# Patient Record
Sex: Male | Born: 1965 | Race: Black or African American | Hispanic: No | Smoking: Never smoker
Health system: Southern US, Community
[De-identification: ages and names within clinical notes are randomized; demographics above are authoritative.]

## PROBLEM LIST (undated history)

## (undated) HISTORY — PX: BACK SURGERY: SHX140

## (undated) HISTORY — PX: ROTATOR CUFF REPAIR: SHX139

---

## 1997-06-06 ENCOUNTER — Encounter: Admission: RE | Admit: 1997-06-06 | Discharge: 1997-09-04 | Payer: Self-pay | Admitting: Neurosurgery

## 1997-08-08 ENCOUNTER — Ambulatory Visit (HOSPITAL_COMMUNITY): Admission: RE | Admit: 1997-08-08 | Discharge: 1997-08-08 | Payer: Self-pay | Admitting: Neurosurgery

## 1997-08-22 ENCOUNTER — Ambulatory Visit (HOSPITAL_COMMUNITY): Admission: RE | Admit: 1997-08-22 | Discharge: 1997-08-22 | Payer: Self-pay | Admitting: Neurosurgery

## 1997-09-05 ENCOUNTER — Ambulatory Visit (HOSPITAL_COMMUNITY): Admission: RE | Admit: 1997-09-05 | Discharge: 1997-09-05 | Payer: Self-pay | Admitting: Neurosurgery

## 1997-09-15 ENCOUNTER — Ambulatory Visit (HOSPITAL_COMMUNITY): Admission: RE | Admit: 1997-09-15 | Discharge: 1997-09-15 | Payer: Self-pay | Admitting: Neurosurgery

## 1997-09-18 ENCOUNTER — Observation Stay (HOSPITAL_COMMUNITY): Admission: EM | Admit: 1997-09-18 | Discharge: 1997-09-18 | Payer: Self-pay | Admitting: Emergency Medicine

## 1998-08-02 ENCOUNTER — Encounter: Payer: Self-pay | Admitting: Neurosurgery

## 1998-08-02 ENCOUNTER — Ambulatory Visit (HOSPITAL_COMMUNITY): Admission: RE | Admit: 1998-08-02 | Discharge: 1998-08-02 | Payer: Self-pay | Admitting: Neurosurgery

## 1998-09-19 ENCOUNTER — Encounter: Admission: RE | Admit: 1998-09-19 | Discharge: 1998-10-10 | Payer: Self-pay

## 1999-02-22 ENCOUNTER — Emergency Department (HOSPITAL_COMMUNITY): Admission: EM | Admit: 1999-02-22 | Discharge: 1999-02-22 | Payer: Self-pay | Admitting: *Deleted

## 1999-05-15 ENCOUNTER — Encounter: Payer: Self-pay | Admitting: Family Medicine

## 1999-05-15 ENCOUNTER — Emergency Department (HOSPITAL_COMMUNITY): Admission: EM | Admit: 1999-05-15 | Discharge: 1999-05-15 | Payer: Self-pay | Admitting: Internal Medicine

## 2003-09-20 ENCOUNTER — Emergency Department (HOSPITAL_COMMUNITY): Admission: EM | Admit: 2003-09-20 | Discharge: 2003-09-21 | Payer: Self-pay | Admitting: Emergency Medicine

## 2004-01-22 ENCOUNTER — Ambulatory Visit: Payer: Self-pay | Admitting: Internal Medicine

## 2004-04-04 ENCOUNTER — Ambulatory Visit (HOSPITAL_COMMUNITY): Admission: RE | Admit: 2004-04-04 | Discharge: 2004-04-04 | Payer: Self-pay | Admitting: Family Medicine

## 2010-09-18 LAB — CMP14+LP+1AC+CBC/D/PLT+TSH: Calcium: 9.5 mg/dL

## 2010-10-02 LAB — PSA: PSA: 1.69

## 2012-05-24 LAB — BASIC METABOLIC PANEL
CREATININE: 1 mg/dL (ref 0.6–1.3)
GLUCOSE: 89 mg/dL
Potassium: 4.7 mmol/L (ref 3.4–5.3)
Sodium: 140 mmol/L (ref 137–147)

## 2012-05-24 LAB — HEPATIC FUNCTION PANEL
ALT: 13 U/L (ref 10–40)
AST: 21 U/L (ref 14–40)

## 2012-05-24 LAB — CBC AND DIFFERENTIAL
Hemoglobin: 13.9 g/dL (ref 13.5–17.5)
Platelets: 265 10*3/uL (ref 150–399)
WBC: 5.8 10*3/mL

## 2012-05-24 LAB — LIPID PANEL
Cholesterol: 164 mg/dL (ref 0–200)
HDL: 37 mg/dL (ref 35–70)
LDL Cholesterol: 111 mg/dL

## 2013-10-28 ENCOUNTER — Encounter: Payer: Self-pay | Admitting: Family Medicine

## 2013-10-28 ENCOUNTER — Ambulatory Visit (INDEPENDENT_AMBULATORY_CARE_PROVIDER_SITE_OTHER): Payer: 59 | Admitting: Family Medicine

## 2013-10-28 VITALS — BP 140/89 | HR 64 | Ht 70.0 in | Wt 204.0 lb

## 2013-10-28 DIAGNOSIS — R319 Hematuria, unspecified: Secondary | ICD-10-CM

## 2013-10-28 DIAGNOSIS — Z8042 Family history of malignant neoplasm of prostate: Secondary | ICD-10-CM | POA: Insufficient documentation

## 2013-10-28 DIAGNOSIS — M25511 Pain in right shoulder: Secondary | ICD-10-CM | POA: Insufficient documentation

## 2013-10-28 DIAGNOSIS — E785 Hyperlipidemia, unspecified: Secondary | ICD-10-CM

## 2013-10-28 DIAGNOSIS — I10 Essential (primary) hypertension: Secondary | ICD-10-CM

## 2013-10-28 DIAGNOSIS — M25519 Pain in unspecified shoulder: Secondary | ICD-10-CM

## 2013-10-28 NOTE — Progress Notes (Signed)
CC: Billy Bell is a 48 y.o. male is here for Establish Care   Subjective: HPI:  Very pleasant 48 year old here to establish care  Patient presents of right shoulder pain has been present for the past 2 or 3 months. Worse when lying on her right shoulder. Worse after lifting heavy objects, worse after shifting gears when driving long distances. Pain is described only as a pain and nonradiating, localized deep in the joint. Denies recent over exertion or trauma.  Denies motor or sensory disturbances in the right upper extremity. No interventions as of yet.  Reports a few months ago he noticed bright red blood in his urine. It occurred during his normal state of health but after holding his urine for what sounds to be 6 hours. He's only had this occur once when it occurred it was not accompanied by dysuria, frequency, urgency, nor any other genitourinary complaints. He reports his father died from prostate cancer around age of 66. Patient denies waking in the night to urinate more than once.  On chart review he's had other visits where his systolic blood pressure was 140. He's never been on blood pressure medication the past has a strong family history of hypertension.  He reports a history of hyperlipidemia and has not had his cholesterol checked in many years  Review of Systems - General ROS: negative for - chills, fever, night sweats, weight gain or weight loss Ophthalmic ROS: negative for - decreased vision Psychological ROS: negative for - anxiety or depression ENT ROS: negative for - hearing change, nasal congestion, tinnitus or allergies Hematological and Lymphatic ROS: negative for -, bruising or swollen lymph nodes Breast ROS: negative Respiratory ROS: no cough, shortness of breath, or wheezing Cardiovascular ROS: no chest pain or dyspnea on exertion Gastrointestinal ROS: no abdominal pain, change in bowel habits, or black or bloody stools Genito-Urinary ROS: negative for - genital  discharge, genital ulcers, incontinence Musculoskeletal ROS: negative for - joint pain or muscle pain other than that described above Neurological ROS: negative for - headaches or memory loss Dermatological ROS: negative for lumps, mole changes, rash and skin lesion changes  History reviewed. No pertinent past medical history.  Past Surgical History  Procedure Laterality Date  . Back surgery     History reviewed. No pertinent family history.  History   Social History  . Marital Status: Divorced    Spouse Name: N/A    Number of Children: N/A  . Years of Education: N/A   Occupational History  . Not on file.   Social History Main Topics  . Smoking status: Never Smoker   . Smokeless tobacco: Not on file  . Alcohol Use: Yes     Comment: occasional  . Drug Use: No  . Sexual Activity: Not on file   Other Topics Concern  . Not on file   Social History Narrative  . No narrative on file     Objective: BP 140/89  Pulse 64  Ht  (1.778 m)  Wt 204 lb (92.534 kg)  BMI 29.27 kg/m2  General: Alert and Oriented, No Acute Distress HEENT: Pupils equal, round, reactive to light. Conjunctivae clear.  Moist mucous membranes pharynx unremarkable Lungs: Clear to auscultation bilaterally, no wheezing/ronchi/rales.  Comfortable work of breathing. Good air movement. Cardiac: Regular rate and rhythm. Normal S1/S2.  No murmurs, rubs, nor gallops.   Right shoulder exam reveals full range of motion and strength in all planes of motion and with individual rotator cuff testing. No overlying  redness warmth or swelling.  Neer's test negative.  Hawkins test positive. Empty can positive. Crossarm test positive. O'Brien's test negative. Apprehension test negative. Speed's test negative. Extremities: No peripheral edema.  Strong peripheral pulses.  Mental Status: No depression, anxiety, nor agitation. Skin: Warm and dry.  Assessment & Plan: Billy Bell was seen today for establish care.  Diagnoses and  associated orders for this visit:  Family history of prostate cancer - PSA - Urinalysis, Routine w reflex microscopic  Hematuria - PSA - Urinalysis, Routine w reflex microscopic  Essential hypertension, benign - BASIC METABOLIC PANEL WITH GFR  Hyperlipidemia - Lipid panel  Right shoulder pain    Family history prostate cancer in the setting of hematuria: Check urinalysis with microscopy along with PSA Essential hypertension: Check renal function, stressed importance of exercise, he'll need to reduce the sodium in his diet  And if ineffective we'll start antihypertensives at his next encounter   hyperlipidemia: Due for lipid panel Right shoulder pain: Suspect rotator cuff tendinitis begin home exercise/rehabilitation on a daily basis the next 3 weeks. If no benefit I've asked him to return to see Billy Bell. in sports medicine for further evaluation  Return if symptoms worsen or fail to improve.

## 2013-11-05 LAB — LIPID PANEL
Cholesterol: 161 mg/dL (ref 0–200)
HDL: 40 mg/dL (ref >39)
LDL Cholesterol: 108 mg/dL — ABNORMAL HIGH (ref 0–99)
Total CHOL/HDL Ratio: 4 Ratio
Triglycerides: 64 mg/dL (ref <150)
VLDL: 13 mg/dL (ref 0–40)

## 2013-11-05 LAB — BASIC METABOLIC PANEL WITH GFR
BUN: 11 mg/dL (ref 6–23)
CO2: 28 mEq/L (ref 19–32)
Calcium: 9.2 mg/dL (ref 8.4–10.5)
Chloride: 104 mEq/L (ref 96–112)
Creat: 0.94 mg/dL (ref 0.50–1.35)
GFR, Est African American: >89 mL/min
GFR, Est Non African American: >89 mL/min
Glucose, Bld: 84 mg/dL (ref 70–99)
Potassium: 5 mEq/L (ref 3.5–5.3)
Sodium: 139 mEq/L (ref 135–145)

## 2013-11-05 LAB — URINALYSIS, ROUTINE W REFLEX MICROSCOPIC
Bilirubin Urine: NEGATIVE
Glucose, UA: NEGATIVE mg/dL
Hgb urine dipstick: NEGATIVE
Ketones, ur: NEGATIVE mg/dL
Leukocytes, UA: NEGATIVE
Nitrite: NEGATIVE
Protein, ur: NEGATIVE mg/dL
Specific Gravity, Urine: 1.02 (ref 1.005–1.030)
Urobilinogen, UA: 0.2 mg/dL (ref 0.0–1.0)
pH: 6 (ref 5.0–8.0)

## 2013-11-05 LAB — PSA: PSA: 1.27 ng/mL (ref <=4.00)

## 2013-11-07 ENCOUNTER — Encounter: Payer: Self-pay | Admitting: Family Medicine

## 2013-11-07 DIAGNOSIS — E785 Hyperlipidemia, unspecified: Secondary | ICD-10-CM | POA: Insufficient documentation

## 2014-01-27 ENCOUNTER — Encounter: Payer: Self-pay | Admitting: Family Medicine

## 2014-01-27 ENCOUNTER — Ambulatory Visit (INDEPENDENT_AMBULATORY_CARE_PROVIDER_SITE_OTHER): Payer: 59 | Admitting: Family Medicine

## 2014-01-27 VITALS — BP 138/91 | HR 64 | Ht 70.0 in | Wt 208.0 lb

## 2014-01-27 DIAGNOSIS — R350 Frequency of micturition: Secondary | ICD-10-CM

## 2014-01-27 DIAGNOSIS — R1030 Lower abdominal pain, unspecified: Secondary | ICD-10-CM

## 2014-01-27 LAB — POCT URINALYSIS DIPSTICK
Bilirubin, UA: NEGATIVE
Blood, UA: NEGATIVE
Glucose, UA: NEGATIVE
Ketones, UA: NEGATIVE
Leukocytes, UA: NEGATIVE
Nitrite, UA: NEGATIVE
Protein, UA: NEGATIVE
Spec Grav, UA: 1.02
Urobilinogen, UA: 4
pH, UA: 7

## 2014-01-27 MED ORDER — TAMSULOSIN HCL 0.4 MG PO CAPS: 0.4000 mg | ORAL_CAPSULE | Freq: Every day | ORAL | Status: DC

## 2014-01-27 MED ORDER — CIPROFLOXACIN HCL 500 MG PO TABS: 500.0000 mg | ORAL_TABLET | Freq: Two times a day (BID) | ORAL | Status: DC

## 2014-01-27 NOTE — Progress Notes (Signed)
CC: Billy Bell is a 48 y.o. male is here for Abdominal Pain   Subjective: HPI:  Complains of lower abdominal pain just below the umbilicus that has been present for about a month now. Symptoms occur on a daily basis. They're predictable and reproducible if he is sitting for more than an hour in a 90 position with respect to his thighs and lumbar spine. The longer he sits in this position the worst the pain is. Pain is relieved by standing erect or lying down or reclining back. No other interventions as of yet. Symptoms have not been getting better or worse since onset. He describes urinary hesitancy with slight burning sensation on initial voiding. Awakens about once a night to urinate. Denies any fevers, chills, rectal pain, blood in stool, constipation or diarrhea. For the same month he's also had a decrease in appetite.   Review Of Systems Outlined In HPI  No past medical history on file.  Past Surgical History  Procedure Laterality Date  . Back surgery     No family history on file.  History   Social History  . Marital Status: Divorced    Spouse Name: N/A    Number of Children: N/A  . Years of Education: N/A   Occupational History  . Not on file.   Social History Main Topics  . Smoking status: Never Smoker   . Smokeless tobacco: Not on file  . Alcohol Use: Yes     Comment: occasional  . Drug Use: No  . Sexual Activity: Not on file   Other Topics Concern  . Not on file   Social History Narrative     Objective: BP 138/91 mmHg  Pulse 64  Ht 5\' 10"  (1.778 m)  Wt 208 lb (94.348 kg)  BMI 29.84 kg/m2  General: Alert and Oriented, No Acute Distress HEENT: Pupils equal, round, reactive to light. Conjunctivae clear.  Moist mucous membranes Lungs: Clear and comfortable work of breathing Cardiac: Regular rate and rhythm. Abdomen: Normal bowel sounds, soft nontender, no rebound or palpable masses. No guarding. Extremities: No peripheral edema.  Strong peripheral pulses.   Mental Status: No depression, anxiety, nor agitation. Skin: Warm and dry.  Urinalysis without abnormality today Bedside ultrasound could not locate his bladder  Assessment & Plan: Billy Bell was seen today for abdominal pain.  Diagnoses and associated orders for this visit:  Urinary frequency - POCT urinalysis dipstick  Suprapubic pain, unspecified laterality - ciprofloxacin (CIPRO) 500 MG tablet; Take 1 tablet (500 mg total) by mouth 2 (two) times daily.  Other Orders - tamsulosin (FLOMAX) 0.4 MG CAPS capsule; Take 1 capsule (0.4 mg total) by mouth daily.    Discussed with patient my suspicion for either mild cystitis/prostatitis therefore start Cipro and if no better after 7 days switch to Flomax for my second suspicion of bladder pain due to incomplete voiding.  25 minutes spent face-to-face during visit today of which at least 50% was counseling or coordinating care regarding: 1. Urinary frequency   2. Suprapubic pain, unspecified laterality       Return if symptoms worsen or fail to improve.

## 2014-02-08 ENCOUNTER — Telehealth: Payer: Self-pay | Admitting: *Deleted

## 2014-02-08 NOTE — Telephone Encounter (Signed)
Pt left a message that ever since he started the last medication rx'ed he has HA's. Called and spoke with pt and he never filled the tamsulosin and didn't complete the abx. I advised that any infection that he may have had may not have cleared up or the HA's could be completley unrelated. reccommended he schedule an appt

## 2014-02-13 ENCOUNTER — Ambulatory Visit (INDEPENDENT_AMBULATORY_CARE_PROVIDER_SITE_OTHER): Payer: 59 | Admitting: Family Medicine

## 2014-02-13 ENCOUNTER — Encounter: Payer: Self-pay | Admitting: Family Medicine

## 2014-02-13 VITALS — BP 149/91 | HR 61 | Temp 98.0°F | Wt 210.0 lb

## 2014-02-13 DIAGNOSIS — R1030 Lower abdominal pain, unspecified: Secondary | ICD-10-CM

## 2014-02-13 NOTE — Progress Notes (Signed)
CC: Billy Bell is a 48 y.o. male is here for possible cystitis or prostitis   Subjective: HPI:  Follow-up lower abdominal pain: States the pain is still present and described as a pressure sensation low in the abdomen without any radiation. It is worse with sitting at a 90 angle or if he's more hunched over sitting. It is improved and almost 100% relieved if he lies back. It is also worsened by eating food but not particularly worsened by drinking anything. He has 1-3 bowel movements a day and states this is normal for him. He denies any diarrhea or constipation. He reports mild urinary hesitancy but denies any dysuria, urinary frequency, nor sensation of incomplete voiding. He only wakes 1 time a night to urinate. Since I saw him last he has a new component of this pain is now accompanied by nausea which is mild in severity but causing him to lose his appetite.  Denies any fevers, chills, unintentional weight loss or gain. Denies any genitourinary complaints.  Symptoms did not improve while taking Cipro so he stopped taking it over the weekend. He never tried Flomax   Review Of Systems Outlined In HPI  No past medical history on file.  Past Surgical History  Procedure Laterality Date  . Back surgery     No family history on file.  History   Social History  . Marital Status: Divorced    Spouse Name: N/A    Number of Children: N/A  . Years of Education: N/A   Occupational History  . Not on file.   Social History Main Topics  . Smoking status: Never Smoker   . Smokeless tobacco: Not on file  . Alcohol Use: Yes     Comment: occasional  . Drug Use: No  . Sexual Activity: Not on file   Other Topics Concern  . Not on file   Social History Narrative     Objective: BP 149/91 mmHg  Pulse 61  Temp(Src) 98 F (36.7 C) (Oral)  Wt 210 lb (95.255 kg)  General: Alert and Oriented, No Acute Distress HEENT: Pupils equal, round, reactive to light. Conjunctivae clear.  Moist mucous  membranes Lungs: Clear and comfortable work of breathing Cardiac: Regular rate and rhythm.  Abdomen: Normal bowel sounds, soft and non tender without palpable masses. No rebound or guarding Extremities: No peripheral edema.  Strong peripheral pulses.  Mental Status: No depression, anxiety, nor agitation. Skin: Warm and dry.  Assessment & Plan: Billy Bell was seen today for possible cystitis or prostitis.  Diagnoses and associated orders for this visit:  Lower abdominal pain - COMPLETE METABOLIC PANEL WITH GFR - CBC    I still think his pain could be due to incomplete voiding of the bladder due to BPH however obtaining metabolic panel and CBC to rule out more serious pathology. He was given stool cards today to screen for any serious oncologic process. Ultimate recommendations and follow-up will be based on the above results and stool cards.   Return if symptoms worsen or fail to improve.

## 2014-02-14 LAB — CBC
HEMATOCRIT: 44.8 % (ref 39.0–52.0)
Hemoglobin: 14.2 g/dL (ref 13.0–17.0)
MCH: 22.9 pg — AB (ref 26.0–34.0)
MCHC: 31.7 g/dL (ref 30.0–36.0)
MCV: 72.3 fL — AB (ref 78.0–100.0)
MPV: 9.9 fL (ref 9.4–12.4)
Platelets: 256 10*3/uL (ref 150–400)
RBC: 6.2 MIL/uL — ABNORMAL HIGH (ref 4.22–5.81)
RDW: 16 % — AB (ref 11.5–15.5)
WBC: 4.9 10*3/uL (ref 4.0–10.5)

## 2014-02-14 LAB — COMPLETE METABOLIC PANEL WITH GFR
ALBUMIN: 4.1 g/dL (ref 3.5–5.2)
ALT: 16 U/L (ref 0–53)
AST: 18 U/L (ref 0–37)
Alkaline Phosphatase: 58 U/L (ref 39–117)
BUN: 11 mg/dL (ref 6–23)
CO2: 29 mEq/L (ref 19–32)
Calcium: 9.6 mg/dL (ref 8.4–10.5)
Chloride: 105 mEq/L (ref 96–112)
Creat: 0.98 mg/dL (ref 0.50–1.35)
GFR, Est African American: >89 mL/min
GFR, Est Non African American: >89 mL/min
Glucose, Bld: 82 mg/dL (ref 70–99)
Potassium: 4.9 mEq/L (ref 3.5–5.3)
Sodium: 141 mEq/L (ref 135–145)
TOTAL PROTEIN: 6.9 g/dL (ref 6.0–8.3)
Total Bilirubin: 0.4 mg/dL (ref 0.2–1.2)

## 2014-02-15 ENCOUNTER — Encounter: Payer: Self-pay | Admitting: Family Medicine

## 2014-02-15 DIAGNOSIS — R7689 Other specified abnormal immunological findings in serum: Secondary | ICD-10-CM | POA: Insufficient documentation

## 2014-02-15 DIAGNOSIS — R768 Other specified abnormal immunological findings in serum: Secondary | ICD-10-CM | POA: Insufficient documentation

## 2014-03-20 ENCOUNTER — Telehealth: Payer: Self-pay | Admitting: *Deleted

## 2014-03-20 ENCOUNTER — Telehealth: Payer: Self-pay | Admitting: Family Medicine

## 2014-03-20 DIAGNOSIS — R1030 Lower abdominal pain, unspecified: Secondary | ICD-10-CM

## 2014-03-20 NOTE — Telephone Encounter (Signed)
Referral has been placed to GI

## 2014-03-20 NOTE — Telephone Encounter (Signed)
A message has been routed to Dr. Ivan AnchorsHommel

## 2014-03-20 NOTE — Telephone Encounter (Signed)
Patient called advised he left a vm this morning with nurse about needing a referral for his stomach concerns. Advised I would send a phone message to adv of request. Thanks

## 2014-03-20 NOTE — Telephone Encounter (Signed)
Pt wants a referral to a specialist for stomach pain. He states the pain is not any better. Will place referral.  (should this be GI or Urology since you think his sxs were from cystitis or prostatitis? It doesn't look like  patient turned in stool cards so not sure if GI is also nec.)

## 2014-03-23 ENCOUNTER — Other Ambulatory Visit: Payer: Self-pay | Admitting: Gastroenterology

## 2014-03-23 DIAGNOSIS — R1033 Periumbilical pain: Secondary | ICD-10-CM

## 2014-03-24 ENCOUNTER — Encounter: Payer: Self-pay | Admitting: Family Medicine

## 2014-03-24 DIAGNOSIS — R1033 Periumbilical pain: Secondary | ICD-10-CM | POA: Insufficient documentation

## 2014-03-27 ENCOUNTER — Ambulatory Visit (INDEPENDENT_AMBULATORY_CARE_PROVIDER_SITE_OTHER): Payer: 59

## 2014-03-27 DIAGNOSIS — R1033 Periumbilical pain: Secondary | ICD-10-CM

## 2014-05-09 ENCOUNTER — Encounter: Payer: Self-pay | Admitting: Family Medicine

## 2014-11-01 ENCOUNTER — Ambulatory Visit (INDEPENDENT_AMBULATORY_CARE_PROVIDER_SITE_OTHER): Payer: BLUE CROSS/BLUE SHIELD | Admitting: Family Medicine

## 2014-11-01 ENCOUNTER — Encounter: Payer: Self-pay | Admitting: Family Medicine

## 2014-11-01 VITALS — BP 136/88 | HR 67 | Wt 202.0 lb

## 2014-11-01 DIAGNOSIS — M545 Low back pain, unspecified: Secondary | ICD-10-CM

## 2014-11-01 MED ORDER — PREDNISONE 20 MG PO TABS: ORAL_TABLET | ORAL | Status: DC

## 2014-11-01 NOTE — Progress Notes (Signed)
CC: Billy Bell is a 49 y.o. male is here for Back Pain   Subjective: HPI:  Midline back pain in the lumbar region that has been present for the past 2 or 3 months. It's described as a stiffness and "locks up". It improves more active he is however if he sits down or stays stationary for about an hour symptoms will return to a moderate degree. No benefit from aspirin. No other interventions as of yet. He denies any recent trauma or overexertion. Pain is nonradiating. He's had a history of radiculopathy but this feels different. Symptoms are also worse with lying on the back or the abdomen. Symptoms can occur any hour of the day. Denies constipation, diarrhea, genitourinary complaints or overlying skin changes   Review Of Systems Outlined In HPI  No past medical history on file.  Past Surgical History  Procedure Laterality Date  . Back surgery  2000,2001  . Rotator cuff repair Left    No family history on file.  Social History   Social History  . Marital Status: Divorced    Spouse Name: N/A  . Number of Children: N/A  . Years of Education: N/A   Occupational History  . Not on file.   Social History Main Topics  . Smoking status: Never Smoker   . Smokeless tobacco: Not on file  . Alcohol Use: Yes     Comment: occasional  . Drug Use: No  . Sexual Activity: Not on file   Other Topics Concern  . Not on file   Social History Narrative     Objective: BP 136/88 mmHg  Pulse 67  Wt 202 lb (91.627 kg)  Vital signs reviewed. General: Alert and Oriented, No Acute Distress HEENT: Pupils equal, round, reactive to light. Conjunctivae clear.  External ears unremarkable.  Moist mucous membranes. Lungs: Clear and comfortable work of breathing, speaking in full sentences without accessory muscle use. Cardiac: Regular rate and rhythm.  Neuro: CN II-XII grossly intact, gait normal. L4 and S1 DTR 2 over 4 bilaterally and symmetric. Back: Pain is reproduced with palpation of the spinous  process of L3, no paraspinal musculature tenderness or hypertonicity. Full range of motion and strength in all 3 planes of the lumbar spine. Pain is worse with extension only mildly reproduced with flexion. Extremities: No peripheral edema.  Strong peripheral pulses.  Mental Status: No depression, anxiety, nor agitation. Logical though process. Skin: Warm and dry.   Assessment & Plan: Billy Bell was seen today for back pain.  Diagnoses and all orders for this visit:  Midline low back pain without sciatica -     predniSONE (DELTASONE) 20 MG tablet; Three tabs daily days 1-3, two tabs daily days 4-6, one tab daily days 7-9, half tab daily days 10-13.   Midline low back pain suspicious for facet joint arthritis therefore start prednisone taper and if no better after a week and a half call for trial of Celebrex.   Return if symptoms worsen or fail to improve.

## 2014-11-03 ENCOUNTER — Telehealth: Payer: Self-pay | Admitting: Family Medicine

## 2014-11-03 NOTE — Telephone Encounter (Signed)
Pt called. He is allergic to Prednisone- medication he was prescribed yesterday.

## 2014-11-06 MED ORDER — CELECOXIB 100 MG PO CAPS: 100.0000 mg | ORAL_CAPSULE | Freq: Two times a day (BID) | ORAL | Status: DC | PRN

## 2014-11-06 NOTE — Telephone Encounter (Signed)
Billy Bell, Celebrex has been sent to his [facility] on [address] as a substitution.  If not better after one week I'd recommend he ask for an appt with one of our sports medicine docs for a second opinion.

## 2014-11-06 NOTE — Telephone Encounter (Signed)
Pt.notified

## 2015-07-10 ENCOUNTER — Ambulatory Visit: Payer: BLUE CROSS/BLUE SHIELD | Admitting: Family Medicine

## 2015-07-11 ENCOUNTER — Encounter: Payer: Self-pay | Admitting: Family Medicine

## 2015-07-11 ENCOUNTER — Ambulatory Visit (INDEPENDENT_AMBULATORY_CARE_PROVIDER_SITE_OTHER): Payer: BLUE CROSS/BLUE SHIELD

## 2015-07-11 ENCOUNTER — Ambulatory Visit (INDEPENDENT_AMBULATORY_CARE_PROVIDER_SITE_OTHER): Payer: BLUE CROSS/BLUE SHIELD | Admitting: Family Medicine

## 2015-07-11 VITALS — BP 133/83 | HR 47 | Wt 204.0 lb

## 2015-07-11 DIAGNOSIS — R1030 Lower abdominal pain, unspecified: Secondary | ICD-10-CM

## 2015-07-11 DIAGNOSIS — R110 Nausea: Secondary | ICD-10-CM | POA: Diagnosis not present

## 2015-07-11 DIAGNOSIS — I10 Essential (primary) hypertension: Secondary | ICD-10-CM

## 2015-07-11 NOTE — Progress Notes (Signed)
CC: Billy Bell is a 50 y.o. male is here for GI Problem   Subjective: HPI:  Follow-up essential hypertension: He is currently not taking any antihypertensive medications right now. He is trying to manage his hypertension with diet and some exercise. He denies any chest pain shortness of breath orthopnea nor peripheral edema.  His major complaint today is lower abdominal pain that's been present for the past 3 weeks. Started with a few day stretch of constipation. He's back to having a bowel movement every day which is formed and slightly improves the pain. It's localized in the lower abdomen and feels like somebody punched him there. He denies any similar component to the pain he was experiencing last year, that was sharp this is dull. He tells me that food seemed to make pain worse. Fluids do not seem to cause any pain. He's had some mild nausea but still has a large appetite. No home remedies or interventions as of yet. He denies fevers, chills, vomiting, diarrhea, nor blood in stool. He denies unintentional weight loss or pain that radiates into the back. He tells me that work for the discomfort in his lower abdomen he be feeling great.   Review Of Systems Outlined In HPI  No past medical history on file.  Past Surgical History  Procedure Laterality Date  . Back surgery  2000,2001  . Rotator cuff repair Left    No family history on file.  Social History   Social History  . Marital Status: Divorced    Spouse Name: N/A  . Number of Children: N/A  . Years of Education: N/A   Occupational History  . Not on file.   Social History Main Topics  . Smoking status: Never Smoker   . Smokeless tobacco: Not on file  . Alcohol Use: Yes     Comment: occasional  . Drug Use: No  . Sexual Activity: Not on file   Other Topics Concern  . Not on file   Social History Narrative     Objective: BP 133/83 mmHg  Pulse 47  Wt 204 lb (92.534 kg)  General: Alert and Oriented, No Acute  Distress HEENT: Pupils equal, round, reactive to light. Conjunctivae clear.  Moist mucous membranes Lungs: Clear to auscultation bilaterally, no wheezing/ronchi/rales.  Comfortable work of breathing. Good air movement. Cardiac: Regular rate and rhythm. Normal S1/S2.  No murmurs, rubs, nor gallops.   Abdomen: Hypoactive bowel sounds. Pain is reproduced with palpation in all 4 quadrants, there is no guarding or rebound tenderness. No palpable masses. No palpable hernia. Extremities: No peripheral edema.  Strong peripheral pulses.  Mental Status: No depression, anxiety, nor agitation. Skin: Warm and dry.  Assessment & Plan: [name] was seen today for gi problem.  Diagnoses and all orders for this visit:  Essential hypertension, benign  Lower abdominal pain -     DG Abd 2 Views   Essential hypertension: Controlled continue diet and exercise endeavors. Lower abdominal pain: High on the differential as constipation, getting an x-ray to confirm this suspicion. Exam does not suggest an acute abdomen.  Return if symptoms worsen or fail to improve.

## 2015-07-27 ENCOUNTER — Ambulatory Visit (INDEPENDENT_AMBULATORY_CARE_PROVIDER_SITE_OTHER): Payer: BLUE CROSS/BLUE SHIELD | Admitting: Family Medicine

## 2015-07-27 ENCOUNTER — Encounter: Payer: Self-pay | Admitting: Family Medicine

## 2015-07-27 ENCOUNTER — Telehealth: Payer: Self-pay

## 2015-07-27 VITALS — BP 151/90 | HR 49 | Wt 206.0 lb

## 2015-07-27 DIAGNOSIS — K5904 Chronic idiopathic constipation: Secondary | ICD-10-CM | POA: Diagnosis not present

## 2015-07-27 MED ORDER — LUBIPROSTONE 24 MCG PO CAPS: 24.0000 ug | ORAL_CAPSULE | Freq: Two times a day (BID) | ORAL | Status: DC

## 2015-07-27 MED ORDER — LINACLOTIDE 145 MCG PO CAPS: 145.0000 ug | ORAL_CAPSULE | Freq: Every day | ORAL | Status: DC

## 2015-07-27 NOTE — Progress Notes (Signed)
CC: Glade StanfordMatt Bell is a 50 y.o. male is here for Abdominal Pain   Subjective: HPI:  Continued lower abdominal pain. No real benefit from MiraLAX. He tells me if anything MiraLAX made his bowel movements less frequent. Pain is described as almost like someone is pushing on his lower abdomen. He denies any genitourinary complaints. He denies any nausea or loss of appetite. Pain is amplified for a few hours if he eats or drinks anything. Ethanol seems make symptoms better or worse. Symptoms are still mild in severity. He denies any vomiting, fevers, chills or night sweats.   Review Of Systems Outlined In HPI  No past medical history on file.  Past Surgical History  Procedure Laterality Date  . Back surgery  2000,2001  . Rotator cuff repair Left    No family history on file.  Social History   Social History  . Marital Status: Divorced    Spouse Name: N/A  . Number of Children: N/A  . Years of Education: N/A   Occupational History  . Not on file.   Social History Main Topics  . Smoking status: Never Smoker   . Smokeless tobacco: Not on file  . Alcohol Use: Yes     Comment: occasional  . Drug Use: No  . Sexual Activity: Not on file   Other Topics Concern  . Not on file   Social History Narrative     Objective: BP 151/90 mmHg  Pulse 49  Wt 206 lb (93.441 kg)  Vital signs reviewed. General: Alert and Oriented, No Acute Distress HEENT: Pupils equal, round, reactive to light. Conjunctivae clear.  External ears unremarkable.  Moist mucous membranes. Lungs: Clear and comfortable work of breathing, speaking in full sentences without accessory muscle use. Cardiac: Regular rate and rhythm.  Abdomen: No palpable masses, no rebound tenderness, no guarding or rigidity. Extremities: No peripheral edema.  Strong peripheral pulses.  Mental Status: No depression, anxiety, nor agitation. Logical though process. Skin: Warm and dry.  Assessment & Plan: Billy Bell was seen today for abdominal  pain.  Diagnoses and all orders for this visit:  Chronic idiopathic constipation -     linaclotide (LINZESS) 145 MCG CAPS capsule; Take 1 capsule (145 mcg total) by mouth daily before breakfast.   Trial of linzess, if no better after 1-2 weeks will order CT scan of the abdomen and pelvis.  Return if symptoms worsen or fail to improve.

## 2015-07-27 NOTE — Telephone Encounter (Signed)
He can try a competitor called amitiza but he'll want to pick up a copay card here first, in your in box, [facility]

## 2015-07-27 NOTE — Telephone Encounter (Signed)
Pt.notified

## 2015-07-27 NOTE — Telephone Encounter (Signed)
Billy Bell called and states the Linzess cost 96 dollars even with the discount card. Billy Bell would like something cheaper. Please advise.

## 2015-07-30 ENCOUNTER — Telehealth: Payer: Self-pay

## 2015-07-30 MED ORDER — SENNOSIDES-DOCUSATE SODIUM 8.6-50 MG PO TABS: 2.0000 | ORAL_TABLET | Freq: Two times a day (BID) | ORAL | Status: AC

## 2015-07-30 NOTE — Telephone Encounter (Signed)
Pt reports that even with savings card amitiza would have cost him $290 out of pocket. He also stated that he didn't have any BM's over the weekend.  Do you have any other suggestions?

## 2015-07-30 NOTE — Telephone Encounter (Signed)
I would recommend taking two OTC tablets of Senokot-S twice a day for the next seven days.

## 2015-07-30 NOTE — Telephone Encounter (Signed)
Pt.notified

## 2016-06-11 IMAGING — US US ABDOMEN COMPLETE
1 series · 13 of 25 positions shown · non-contrast
Comparison: Lumbar spine MRI 04/04/2004.

CLINICAL DATA: Periumbilical abdominal pain for 2 months

EXAM:
ULTRASOUND ABDOMEN COMPLETE

[Series 1: us abdomen complete · 0.21mm/px · 13 of 84 slices shown]
[im 1/84]
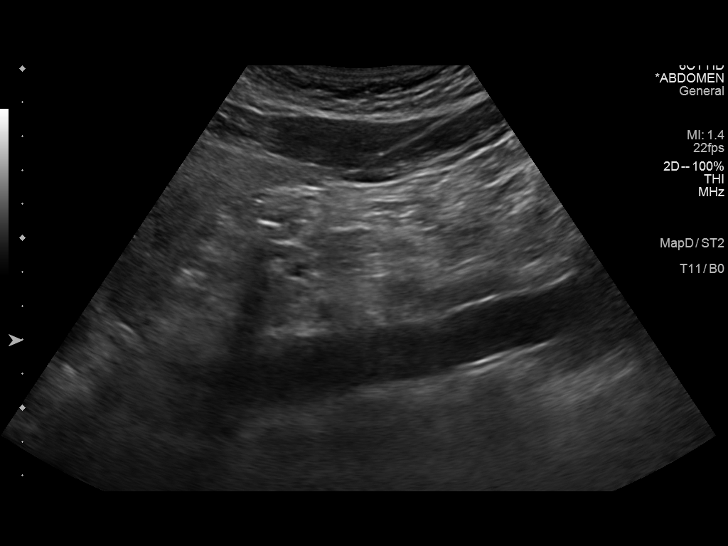
[im 7/84]
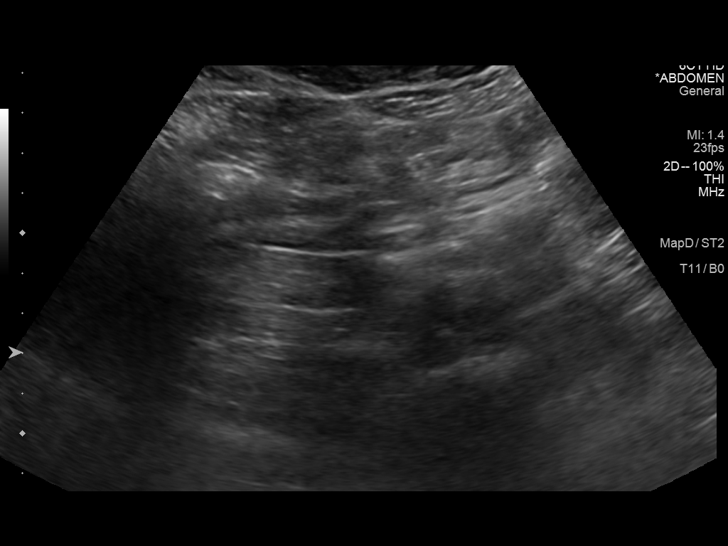
[im 14/84]
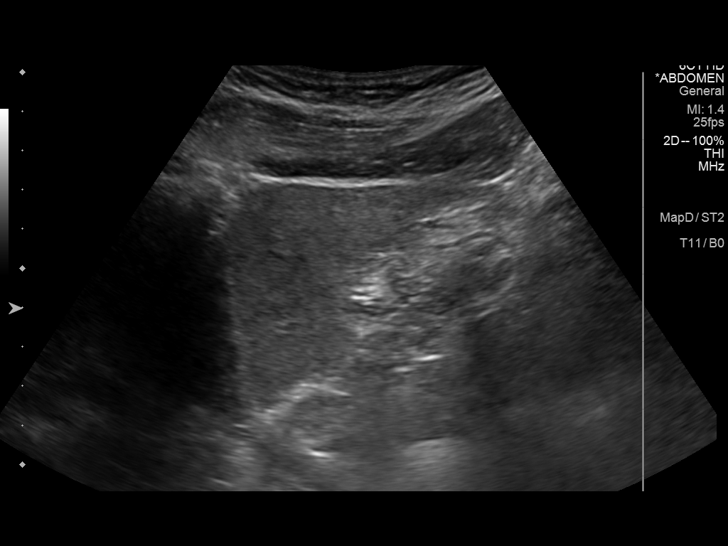
[im 21/84]
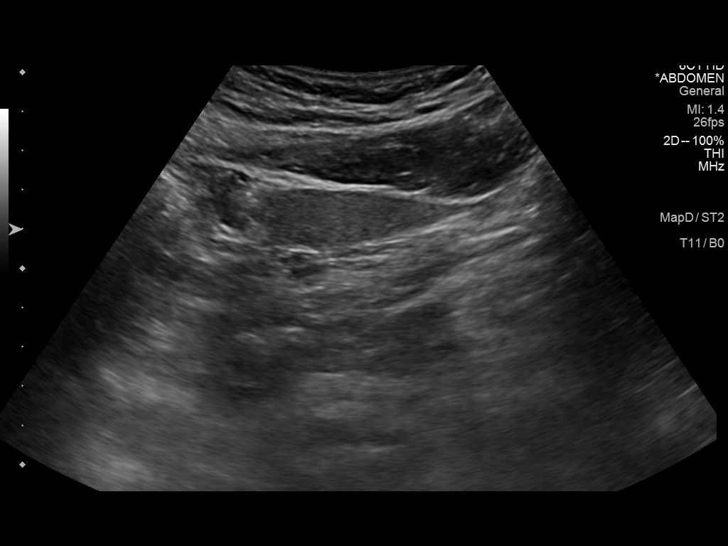
[im 28/84]
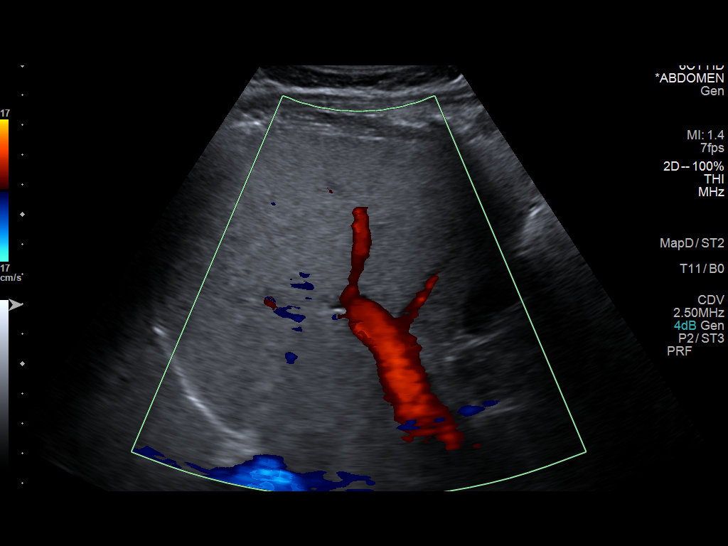
[im 35/84]
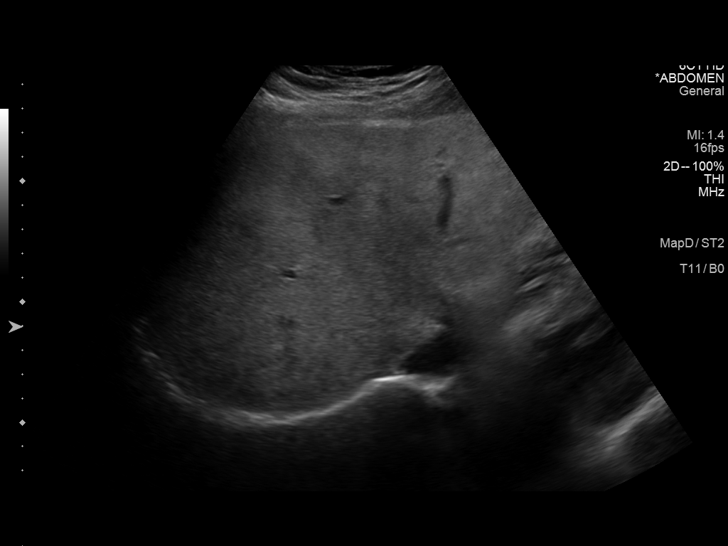
[im 42/84]
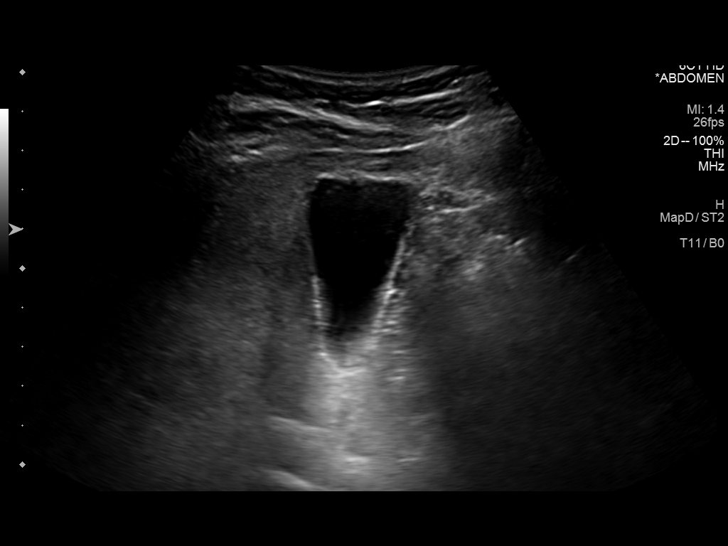
[im 49/84]
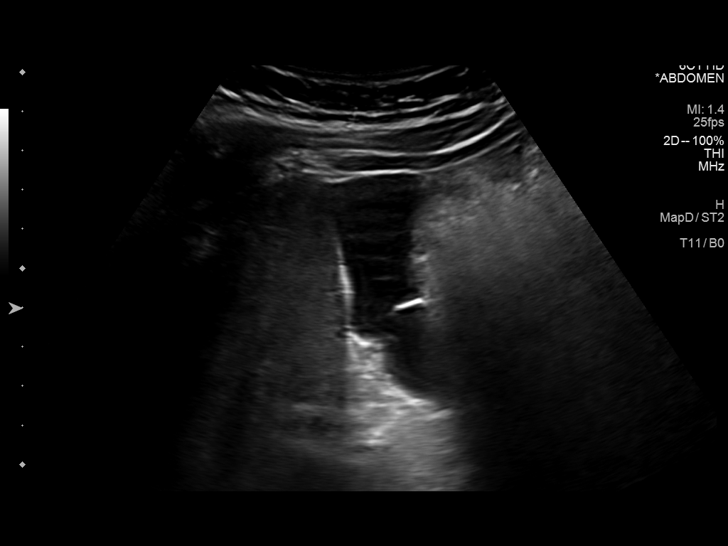
[im 56/84]
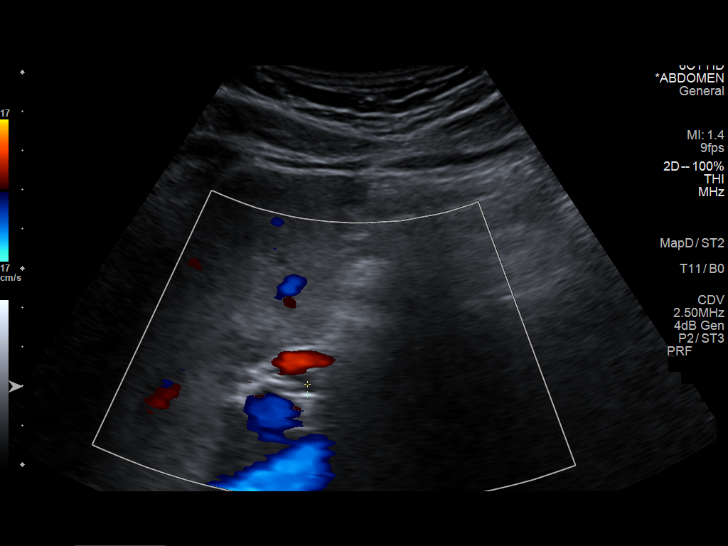
[im 63/84]
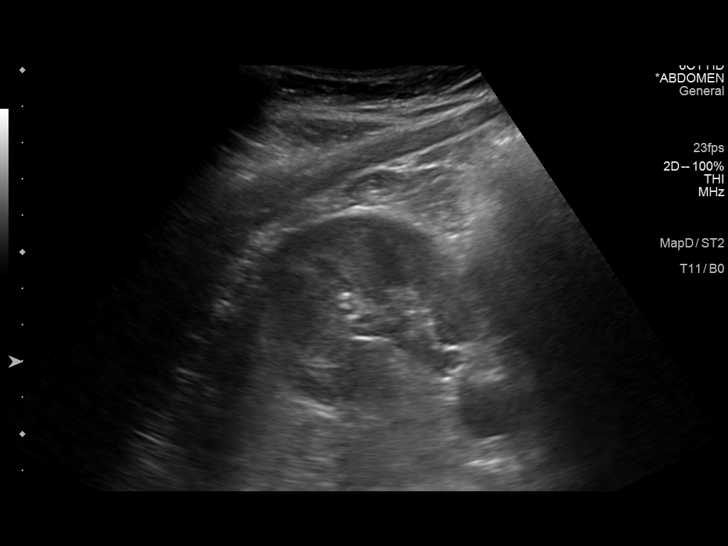
[im 70/84]
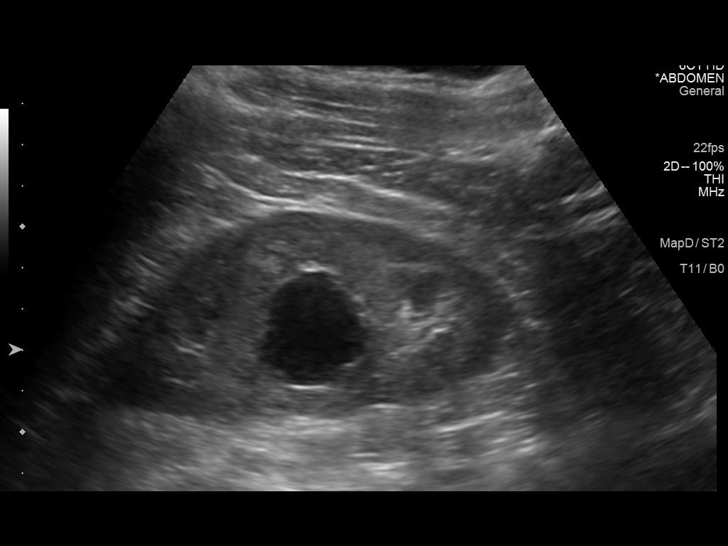
[im 77/84]
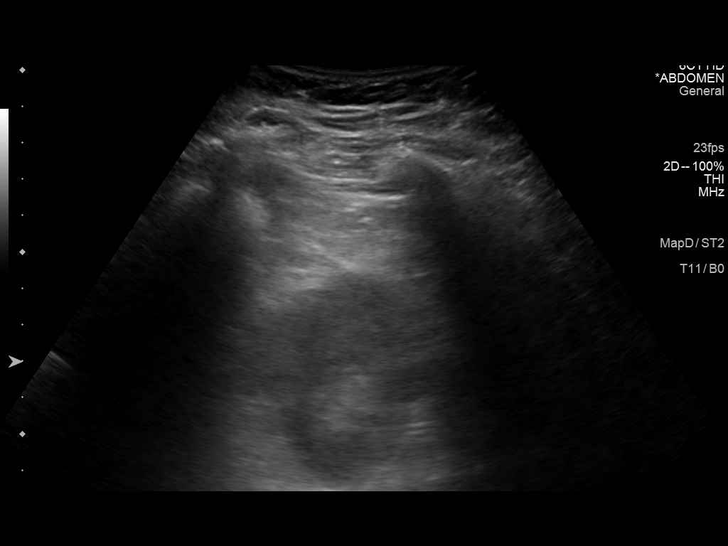
[im 84/84]
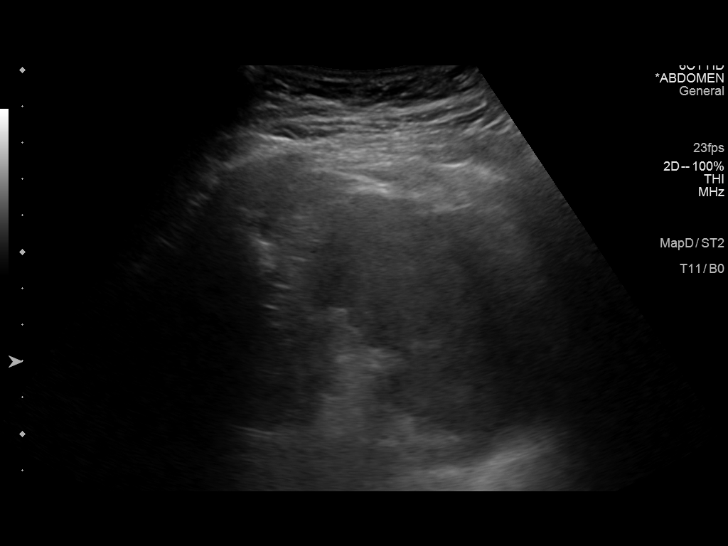

[13 of 25 positions shown; findings below may reference images not displayed]

FINDINGS: Gallbladder: No gallstones or wall thickening visualized. No
sonographic Murphy sign noted.

Common bile duct: Diameter: 2.5 mm in diameter within normal limits.

Liver: No focal lesion identified. There is diffuse increased
echogenicity of the liver suspicious for fatty infiltration.

IVC: Limited visualization due to bowel gas.

Pancreas: Limited assessment due to abundant bowel gas

Spleen: Size and appearance within normal limits. Measures 5.7 cm in
length.

Right Kidney: Length: 10.4 cm in length. Echogenicity within normal
limits. No mass or hydronephrosis visualized.

Left Kidney: Length: 10.8 cm in length.. There is a central cyst in
midpole measures 2.8 x 2.6 cm. On the prior MRI measures 1.8 cm in
diameter.

Abdominal aorta: No aneurysm visualized. Measures up to 1.9 cm in
diameter.

Other findings: None.
IMPRESSION: 1. No gallstones are noted within gallbladder.
2. Normal CBD.
3. Probable fatty infiltration of the liver.
4. Limited assessment of the pancreas and IVC due to abundant bowel
gas.
5. No hydronephrosis. A central cyst in midpole of the left kidney
measures 2.8 cm.

## 2017-09-25 IMAGING — DX DG ABDOMEN 2V
3 series · 3 of 3 positions shown · non-contrast
Comparison: None in PACs

CLINICAL DATA: Lower abdominal pain and tenderness for the past 3
weeks associated with nausea.

EXAM:
ABDOMEN - 2 VIEW

[abdomen erect]
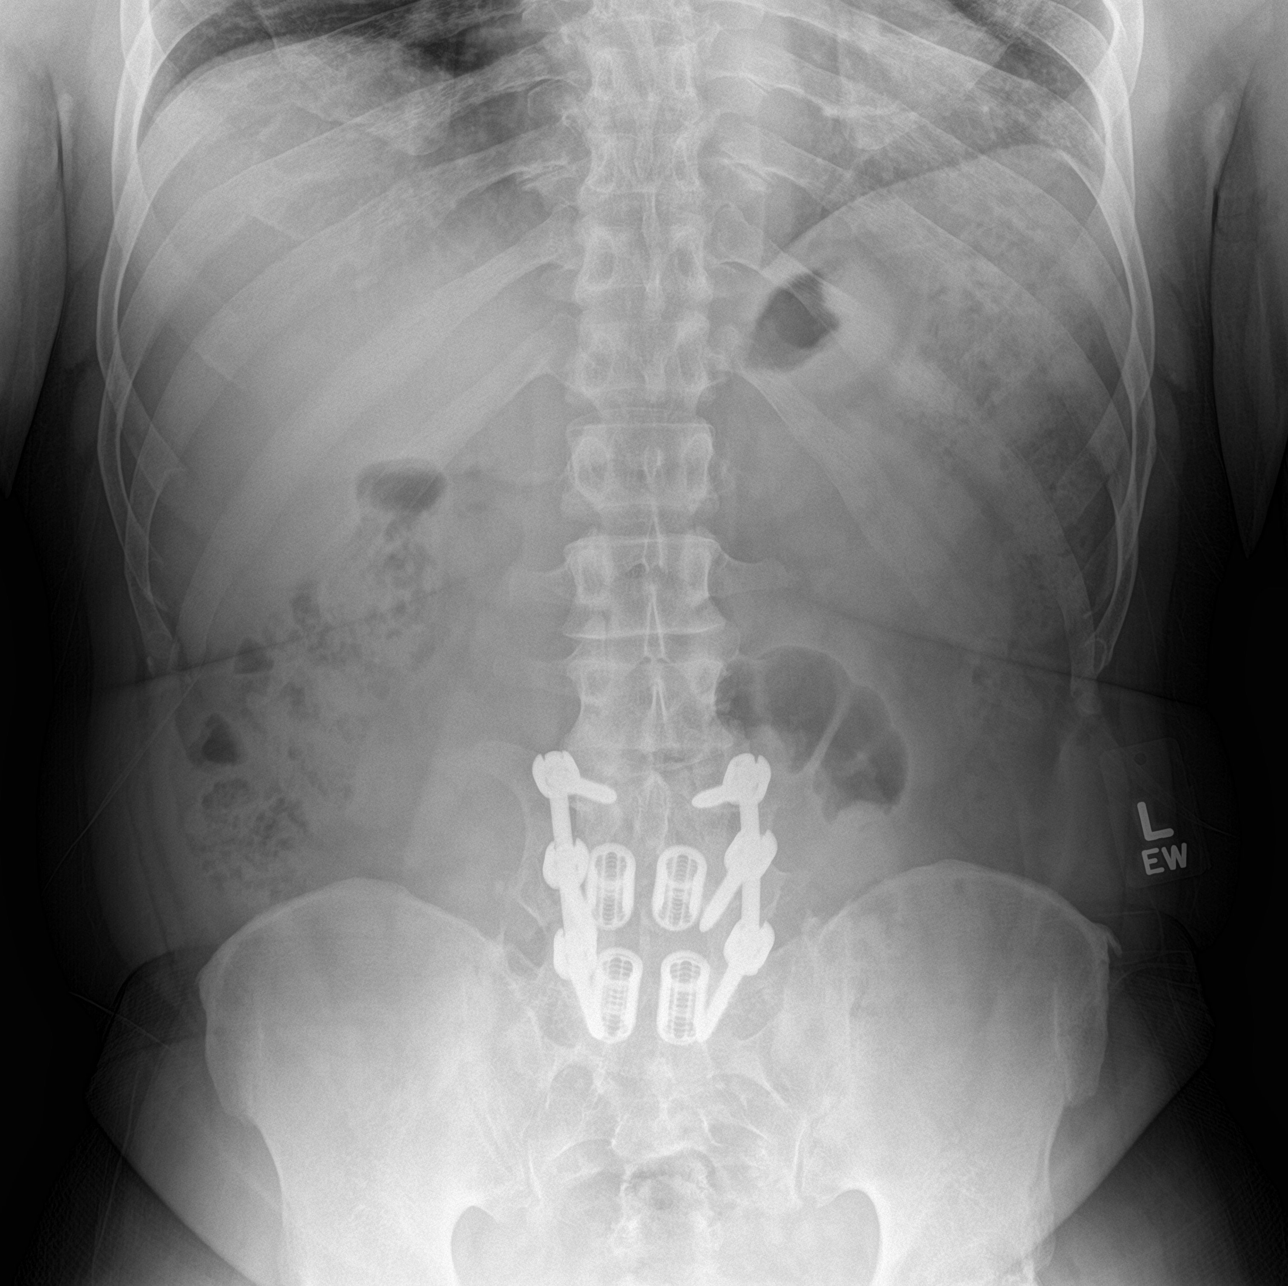

[abdomen supine (1 of 2)]
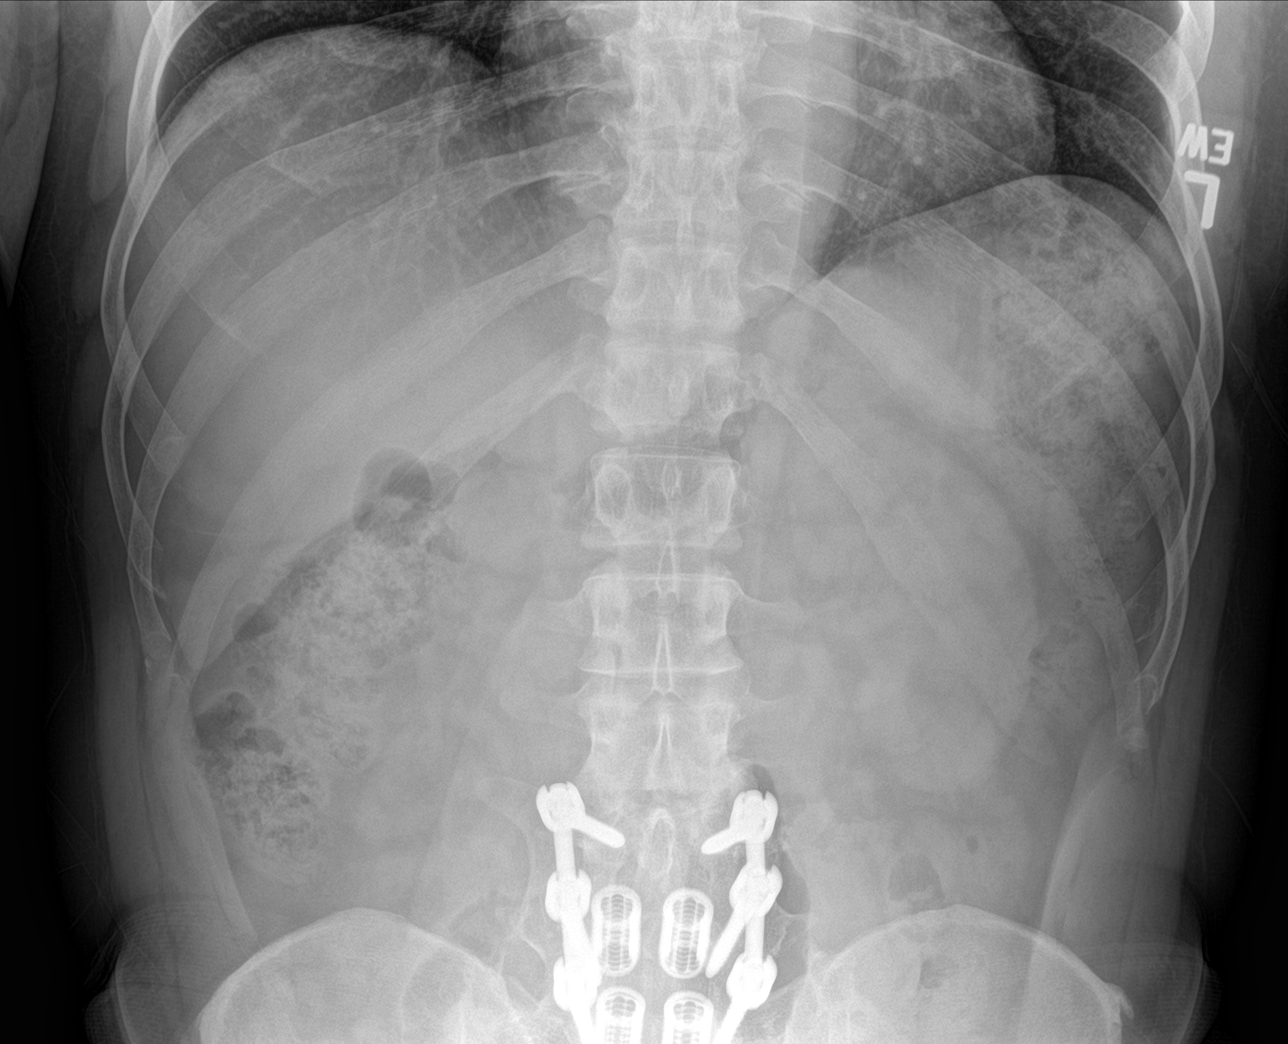

[abdomen supine (2 of 2)]
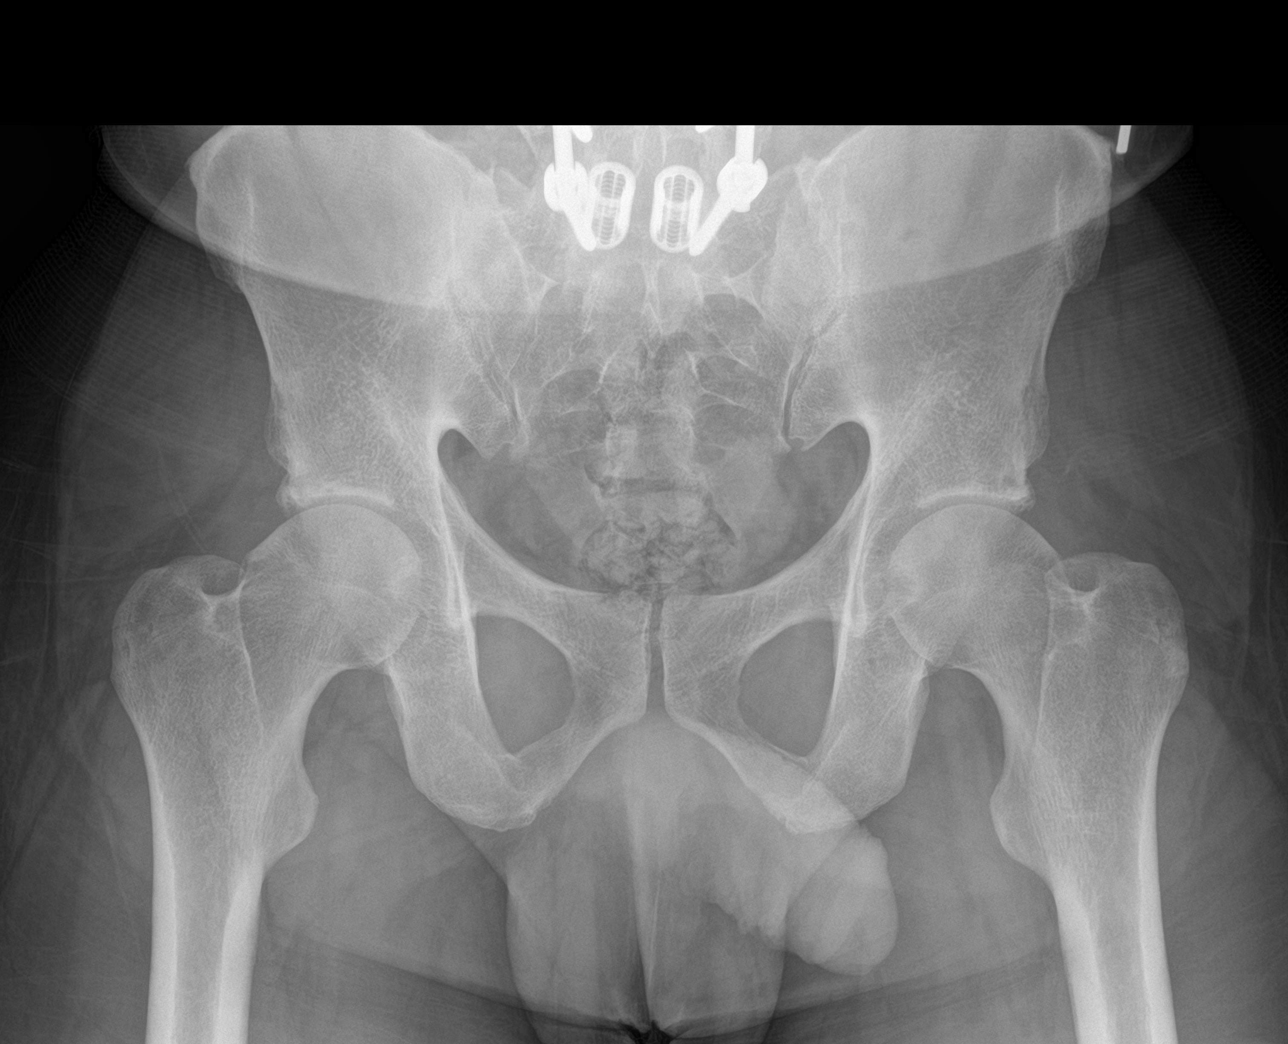

[3 of 3 positions shown; findings below may reference images not displayed]

FINDINGS: The colonic stool burden is moderately increased. There is no small
or large bowel obstructive pattern. There is no fecal impaction. No
free extraluminal gas collections are observed. There are no
abnormal soft tissue calcifications. The patient has undergone
posterior and interbody fusion at L4-5 and L5-S1.
IMPRESSION: Moderately increased colonic stool burden may be normal for the
patient but could reflect constipation in the appropriate clinical
setting. No acute intra-abdominal abnormality is observed.
